# Patient Record
Sex: Female | Born: 1960 | Race: White | Hispanic: No | Marital: Single
Health system: Southern US, Community
[De-identification: ages and names within clinical notes are randomized; demographics above are authoritative.]

## PROBLEM LIST (undated history)

## (undated) DIAGNOSIS — C801 Malignant (primary) neoplasm, unspecified: Secondary | ICD-10-CM

## (undated) DIAGNOSIS — E079 Disorder of thyroid, unspecified: Secondary | ICD-10-CM

## (undated) DIAGNOSIS — I10 Essential (primary) hypertension: Secondary | ICD-10-CM

## (undated) DIAGNOSIS — E119 Type 2 diabetes mellitus without complications: Secondary | ICD-10-CM

## (undated) DIAGNOSIS — G629 Polyneuropathy, unspecified: Secondary | ICD-10-CM

## (undated) DIAGNOSIS — I82409 Acute embolism and thrombosis of unspecified deep veins of unspecified lower extremity: Secondary | ICD-10-CM

## (undated) HISTORY — DX: Acute embolism and thrombosis of unspecified deep veins of unspecified lower extremity: I82.409

## (undated) HISTORY — DX: Type 2 diabetes mellitus without complications: E11.9

## (undated) HISTORY — DX: Essential (primary) hypertension: I10

## (undated) HISTORY — DX: Malignant (primary) neoplasm, unspecified: C80.1

## (undated) HISTORY — DX: Polyneuropathy, unspecified: G62.9

## (undated) HISTORY — DX: Disorder of thyroid, unspecified: E07.9

---

## 2003-05-12 ENCOUNTER — Encounter: Admission: RE | Admit: 2003-05-12 | Discharge: 2003-05-12 | Payer: Self-pay | Admitting: *Deleted

## 2003-05-12 ENCOUNTER — Encounter: Payer: Self-pay | Admitting: General Surgery

## 2020-11-19 ENCOUNTER — Emergency Department (HOSPITAL_BASED_OUTPATIENT_CLINIC_OR_DEPARTMENT_OTHER)
Admission: EM | Admit: 2020-11-19 | Discharge: 2020-11-20 | Disposition: A | Discharge status: Home / Self Care | Payer: Medicare Other | Attending: Emergency Medicine | Admitting: Emergency Medicine

## 2020-11-19 ENCOUNTER — Emergency Department (HOSPITAL_BASED_OUTPATIENT_CLINIC_OR_DEPARTMENT_OTHER): Payer: Medicare Other

## 2020-11-19 ENCOUNTER — Other Ambulatory Visit: Payer: Self-pay

## 2020-11-19 ENCOUNTER — Encounter (HOSPITAL_BASED_OUTPATIENT_CLINIC_OR_DEPARTMENT_OTHER): Payer: Self-pay | Admitting: Emergency Medicine

## 2020-11-19 DIAGNOSIS — Z859 Personal history of malignant neoplasm, unspecified: Secondary | ICD-10-CM | POA: Insufficient documentation

## 2020-11-19 DIAGNOSIS — E119 Type 2 diabetes mellitus without complications: Secondary | ICD-10-CM | POA: Insufficient documentation

## 2020-11-19 DIAGNOSIS — Z7901 Long term (current) use of anticoagulants: Secondary | ICD-10-CM | POA: Diagnosis not present

## 2020-11-19 DIAGNOSIS — S92302A Fracture of unspecified metatarsal bone(s), left foot, initial encounter for closed fracture: Secondary | ICD-10-CM | POA: Insufficient documentation

## 2020-11-19 DIAGNOSIS — I10 Essential (primary) hypertension: Secondary | ICD-10-CM | POA: Diagnosis not present

## 2020-11-19 DIAGNOSIS — W01198A Fall on same level from slipping, tripping and stumbling with subsequent striking against other object, initial encounter: Secondary | ICD-10-CM | POA: Diagnosis not present

## 2020-11-19 DIAGNOSIS — S99922A Unspecified injury of left foot, initial encounter: Secondary | ICD-10-CM | POA: Diagnosis present

## 2020-11-19 DIAGNOSIS — S0990XA Unspecified injury of head, initial encounter: Secondary | ICD-10-CM | POA: Diagnosis not present

## 2020-11-19 LAB — CBG MONITORING, ED: Glucose-Capillary: 108 mg/dL — ABNORMAL HIGH (ref 70–99)

## 2020-11-19 NOTE — ED Notes (Signed)
Pt offered pain medication, refused r/t not having eaten

## 2020-11-19 NOTE — ED Triage Notes (Signed)
Pt arrives pov with family with c/o left foot injury r/t fall pta. Pt endorse feeling "pop". Pt reports hitting head, reports neck pain. Pt denies loc. Swelling noted. Pt no vaccinated for Covid

## 2020-11-19 NOTE — ED Notes (Signed)
Pt endorses concern for elevated glucose, cbg ordered

## 2020-11-19 NOTE — ED Notes (Signed)
Patient transported to CT 

## 2020-11-20 DIAGNOSIS — S92302A Fracture of unspecified metatarsal bone(s), left foot, initial encounter for closed fracture: Secondary | ICD-10-CM | POA: Diagnosis not present

## 2020-11-20 MED ORDER — OXYCODONE-ACETAMINOPHEN 5-325 MG PO TABS
1.0000 | ORAL_TABLET | Freq: Three times a day (TID) | ORAL | 0 refills | Status: DC | PRN
Start: 2020-11-20 — End: 2020-11-20

## 2020-11-20 MED ORDER — OXYCODONE-ACETAMINOPHEN 5-325 MG PO TABS
1.0000 | ORAL_TABLET | Freq: Once | ORAL | Status: AC
  Administered 2020-11-20: 01:00:00 1 via ORAL
  Filled 2020-11-20: qty 1

## 2020-11-20 MED ORDER — OXYCODONE-ACETAMINOPHEN 5-325 MG PO TABS: 1.0000 | ORAL_TABLET | Freq: Three times a day (TID) | ORAL | 0 refills | Status: AC | PRN

## 2020-11-20 NOTE — ED Provider Notes (Signed)
[facility] EMERGENCY DEPARTMENT Provider Note   CSN: [id number] Arrival date & time: 11/19/20  1729     History Chief Complaint  Patient presents with  . Fall    Stacey Hall is a 59 y.o. female.  HPI Patient hurt her left foot on Christmas Eve.  States she stepped and twisted the foot.  States she felt some pops in there.  Continued pain.  Unrelieved with medicines at home.  Some bruising of the foot.  States she also fell and hit her head.  States her neck feels as if there is cracking in there.  States she is on Plavix.  No numbness or weakness.  Has had issues with her left foot in the past.  Patient is incontinent at baseline.  Does have some difficulty getting around the house.  Patient is unvaccinated for Covid. 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  Patient initially stated DVT but it sounds more as if it was the femoral artery.  Patient states she has a walker at home.    Past Medical History:  Diagnosis Date  . Cancer (HCC)   . Diabetes mellitus without complication (HCC)   . DVT (deep venous thrombosis) (HCC)   . Hypertension   . Neuropathy   . Thyroid disease     There are no problems to display for this patient.      OB History   No obstetric history on file.     No family history on file.     Home Medications Prior to Admission medications   Medication Sig Start Date End Date Taking? Authorizing Provider  oxyCODONE-acetaminophen (PERCOCET/ROXICET) 5-325 MG tablet Take 1-2 tablets by mouth every 8 (eight) hours as needed for severe pain. 11/20/20   Benjiman Core, MD    Allergies    Patient has no allergy information on record.  Review of Systems   Review of Systems  Constitutional: Negative for appetite change.  HENT: Negative for congestion.   Respiratory: Positive for cough.   Cardiovascular: Negative for chest pain and leg swelling.  Gastrointestinal: Negative for abdominal pain.  Genitourinary:  Negative for flank pain.  Musculoskeletal: Positive for gait problem and neck pain. Negative for back pain.  Skin: Negative for rash.  Psychiatric/Behavioral: Negative for confusion.    Physical Exam Updated Vital Signs BP 137/74   Pulse 78   Temp 98 F (36.7 C)   Resp 16   Ht 5\' 2"  (1.575 m)   Wt 113.4 kg   SpO2 99%   BMI 45.73 kg/m   Physical Exam Vitals and nursing note reviewed.  Constitutional:      Appearance: She is obese.  HENT:     Head: Atraumatic.     Mouth/Throat:     Mouth: Mucous membranes are moist.  Eyes:     Extraocular Movements: Extraocular movements intact.     Pupils: Pupils are equal, round, and reactive to light.  Cardiovascular:     Rate and Rhythm: Normal rate.  Pulmonary:     Breath sounds: No wheezing or rhonchi.     Comments: Mildly harsh breath sounds without focal rales or rhonchi. Abdominal:     Tenderness: There is no abdominal tenderness.  Musculoskeletal:     Comments: Tenderness over left forefoot.  Some ecchymosis.  Mild cervical spine tenderness.  No deformity.  No step-off.  Skin:    General: Skin is warm.     Capillary Refill: Capillary refill takes less than  2 seconds.  Neurological:     Mental Status: She is alert and oriented to person, place, and time.  Psychiatric:        Mood and Affect: Mood normal.     ED Results / Procedures / Treatments   Labs (all labs ordered are listed, but only abnormal results are displayed) Labs Reviewed  CBG MONITORING, ED - Abnormal; Notable for the following components:      Result Value   Glucose-Capillary 108 (*)    All other components within normal limits    EKG None  Radiology CT Head Wo Contrast  Result Date: 11/19/2020 CLINICAL DATA:  Head trauma, coagulopathy (Age 17-64y) Fall striking head.  No loss of consciousness. EXAM: CT HEAD WITHOUT CONTRAST TECHNIQUE: Contiguous axial images were obtained from the base of the skull through the vertex without intravenous  contrast. COMPARISON:  Report from prior head CT 07/17/2012, images not available. FINDINGS: Brain: No intracranial hemorrhage, mass effect, or midline shift. No hydrocephalus. The basilar cisterns are patent. Mild periventricular chronic small vessel ischemia. No evidence of territorial infarct or acute ischemia. No extra-axial or intracranial fluid collection. Vascular: No hyperdense vessel. Skull: No fracture or focal lesion. Sinuses/Orbits: Paranasal sinuses and mastoid air cells are clear. The visualized orbits are unremarkable. Other: None. IMPRESSION: 1. No acute intracranial abnormality. No skull fracture. 2. Mild chronic small vessel ischemia. Electronically Signed   By: Keith Rake M.D.   On: 11/19/2020 23:44   CT Cervical Spine Wo Contrast  Result Date: 11/19/2020 CLINICAL DATA:  Neck trauma, dangerous injury mechanism (Age 29-64y) Fall striking head.  Neck pain. EXAM: CT CERVICAL SPINE WITHOUT CONTRAST TECHNIQUE: Multidetector CT imaging of the cervical spine was performed without intravenous contrast. Multiplanar CT image reconstructions were also generated. COMPARISON:  None. FINDINGS: Alignment: Straightening of normal lordosis. Scoliotic curvature of the included upper thoracic spine. No traumatic subluxation. Skull base and vertebrae: Anterior fusion C5 through C7, hardware is intact. No acute fracture. Dens and skull base are intact. Soft tissues and spinal canal: No prevertebral fluid or swelling. No visible canal hematoma. Disc levels: Interbody spacer C5-C6 and C6-C7. Minor adjacent level disc space narrowing at C4-C5 and C7-T1. Upper chest: No acute findings. Other: Carotid calcifications versus stents. IMPRESSION: 1. No acute fracture or subluxation of the cervical spine. 2. Anterior fusion C5 through C7, hardware is intact. Electronically Signed   By: Keith Rake M.D.   On: 11/19/2020 23:47   DG Foot Complete Left  Result Date: 11/19/2020 CLINICAL DATA:  Fall, swelling.  EXAM: LEFT FOOT - COMPLETE 3+ VIEW COMPARISON:  Plain film of the LEFT foot dated 07/06/2020. FINDINGS: Displaced fractures of the second and third metatarsal bones. Associated 8 mm avulsion fracture fragment within the soft tissues in between the first and second metatarsal heads. Alignment at the overlying second and third MTP joints remains normal. Associated soft tissue swelling. IMPRESSION: Displaced fractures of the distal second and third metatarsal bones. Associated 8 mm avulsion fracture fragment within the soft tissues between the first and second metatarsal heads. Electronically Signed   By: Franki Cabot M.D.   On: 11/19/2020 19:14    Procedures Procedures (including critical care time)  Medications Ordered in ED Medications  oxyCODONE-acetaminophen (PERCOCET/ROXICET) 5-325 MG per tablet 1 tablet (1 tablet Oral Given 11/20/20 0042)    ED Course  I have reviewed the triage vital signs and the nursing notes.  Pertinent labs & imaging results that were available during my care of the patient were  reviewed by me and considered in my medical decision making (see chart for details).    MDM Rules/Calculators/A&P                         Patient with foot injury.  2 metatarsal fractures.  Some displacement.  Splinted.  Has walker at home.  Will give pain medicine.  Doubt DVT at this time.  Head CT done due to fall and anticoagulation.  Also cervical spine imaging done.  Both overall reassuring.  Discharge home with outpatient follow-up.  Doubt other severe injury from the fall. Final Clinical Impression(s) / ED Diagnoses Final diagnoses:  Closed displaced fracture of metatarsal bone of left foot, unspecified metatarsal, initial encounter    Rx / DC Orders ED Discharge Orders         Ordered    oxyCODONE-acetaminophen (PERCOCET/ROXICET) 5-325 MG tablet  Every 8 hours PRN,   Status:  Discontinued        11/20/20 0124    oxyCODONE-acetaminophen (PERCOCET/ROXICET) 5-325 MG tablet  Every  8 hours PRN        11/20/20 0128           Davonna Belling, MD 11/20/20 0134

## 2020-11-20 NOTE — ED Notes (Signed)
Had to reapply splint due to pt urinating on other splint

## 2020-11-20 NOTE — ED Notes (Signed)
Splint reapplied after pt incontinent of urine. Enc pt to keep splint clean/dry, gave extra gauze roll after re-application of new splint.

## 2022-06-22 IMAGING — CT CT CERVICAL SPINE W/O CM
3 of 4 series · 10 of 33 positions shown, 12 images · non-contrast
Comparison: None.

CLINICAL DATA: Neck trauma, dangerous injury mechanism (Age 16-64y)

Fall striking head.  Neck pain.
EXAM:
CT CERVICAL SPINE WITHOUT CONTRAST
TECHNIQUE: Multidetector CT imaging of the cervical spine was performed without
intravenous contrast. Multiplanar CT image reconstructions were also
generated.

[Series 5: coronals · coronal · 0.23mm/px · 3 of 41 slices shown]
[im 9/41  bone]
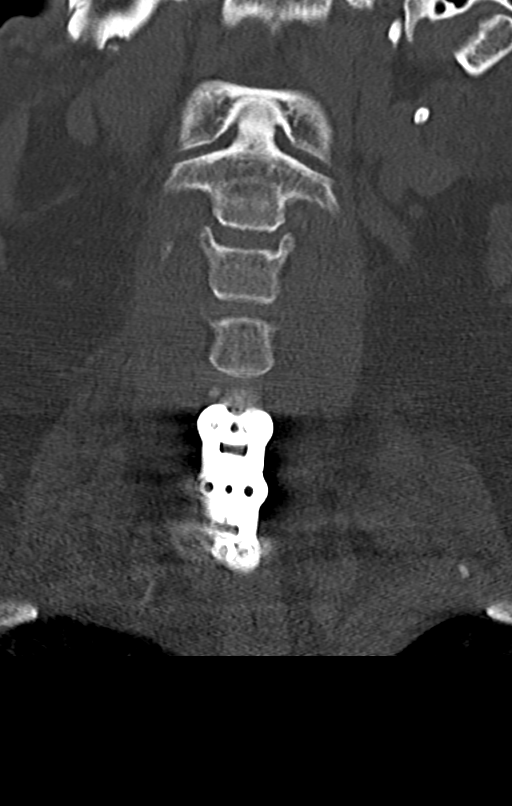
[im 17/41  bone]
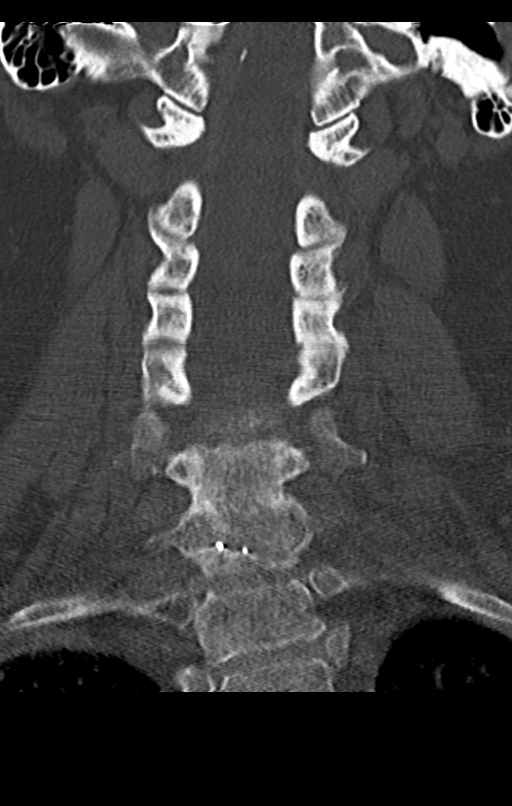
[im 25/41  bone]
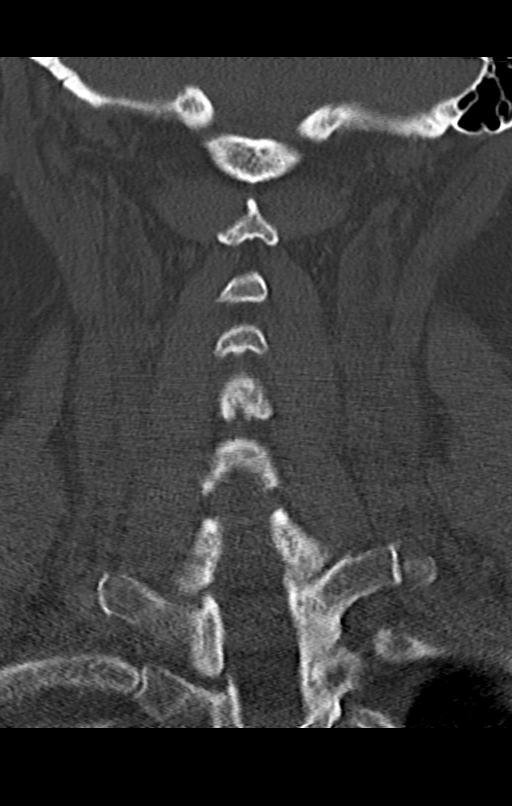

[Series 6: sagittals · sagittal · 0.21mm/px · 5 of 44 slices shown, 6 images]
[im 15/44  bone]
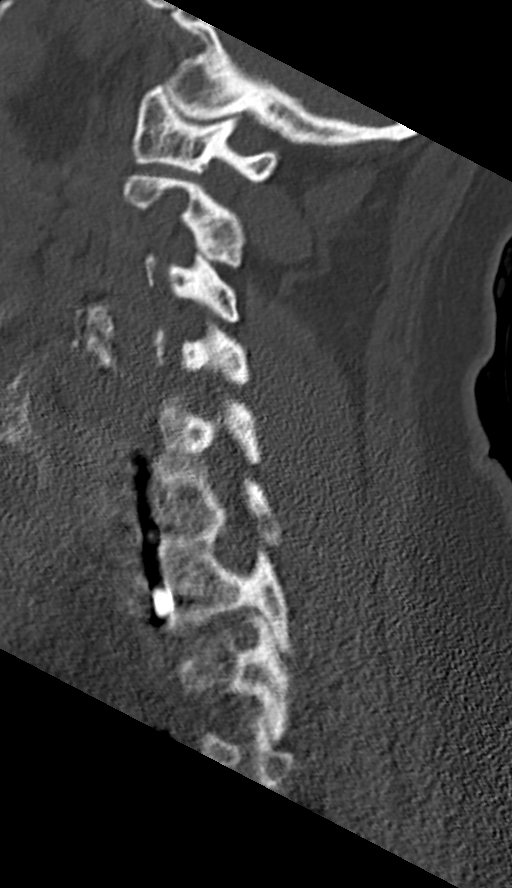
[im 18/44  bone]
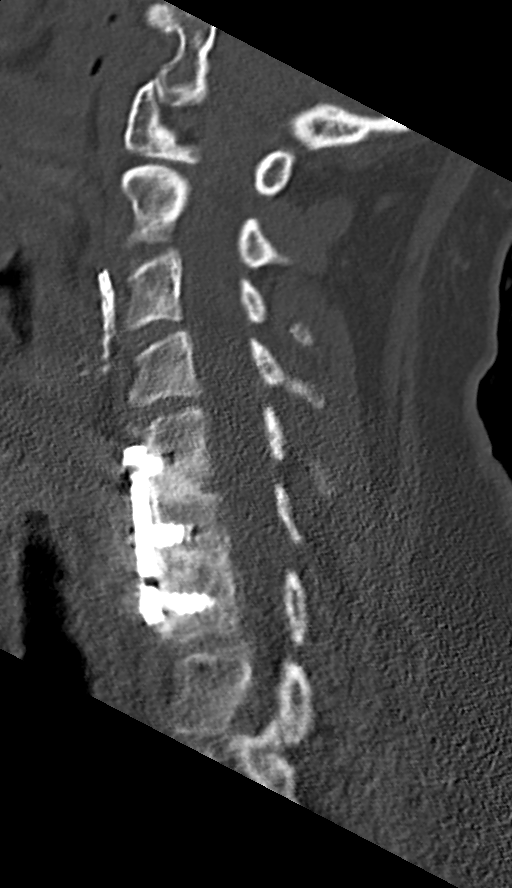
[im 22/44  soft-tissue]
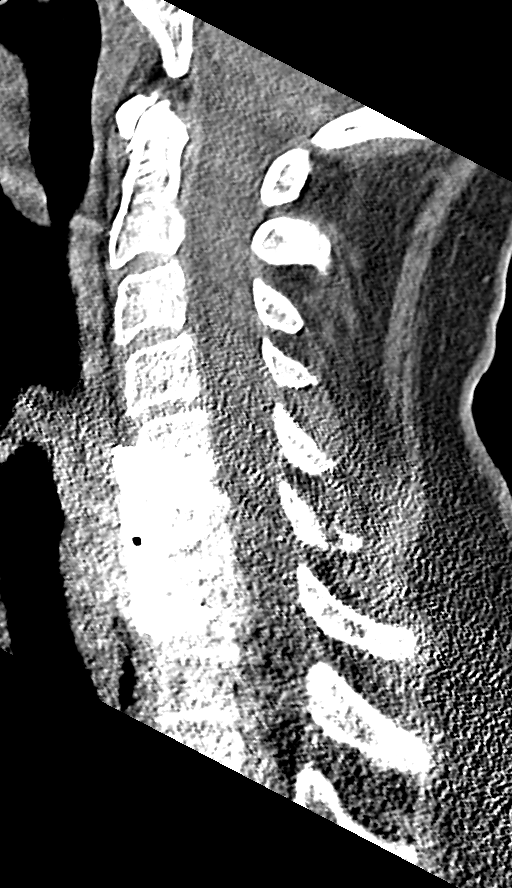
[im 22/44  bone]
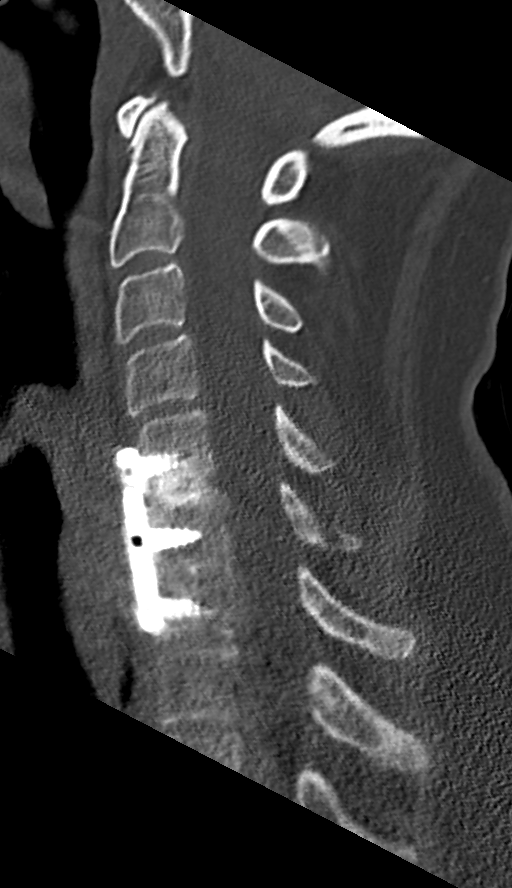
[im 26/44  bone]
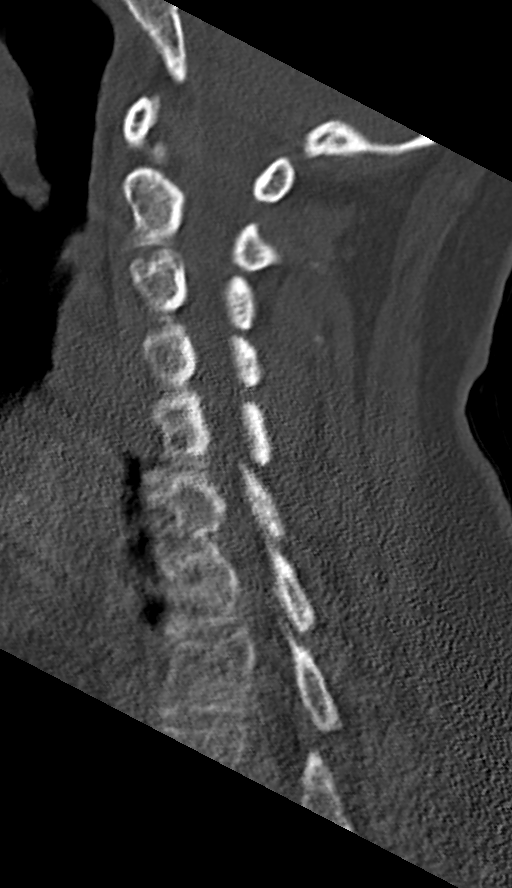
[im 29/44  bone]
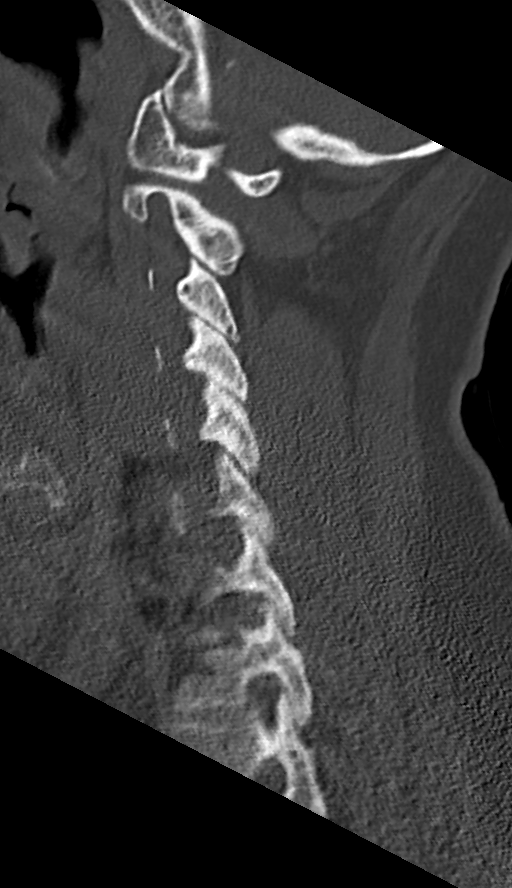

[Series 7: orthogonals · axial · 0.16mm/px · z∈[+899,+972]mm · 2 of 95 slices shown, 3 images]
[im 27/95  soft-tissue]
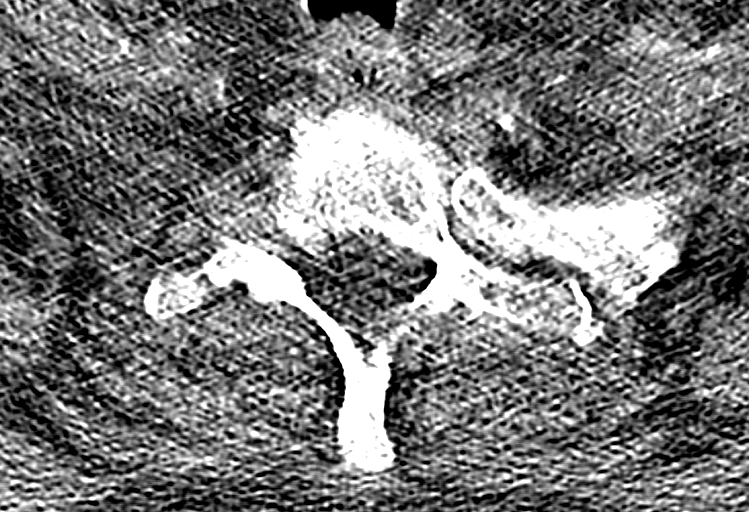
[im 27/95  bone]
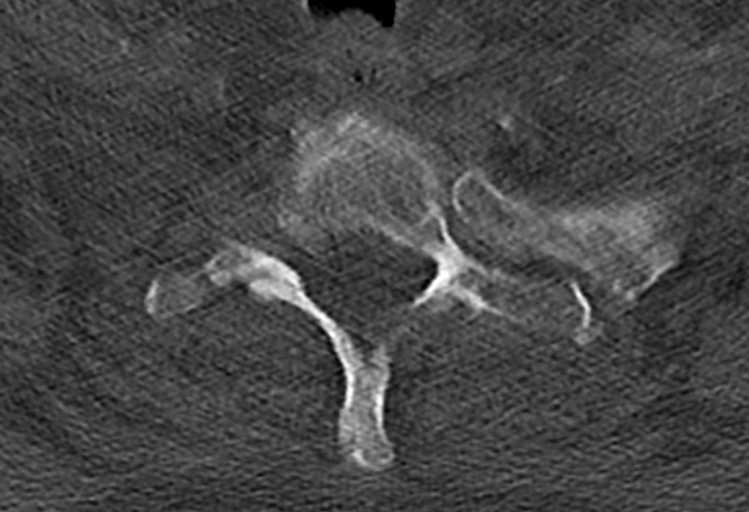
[im 68/95  bone]
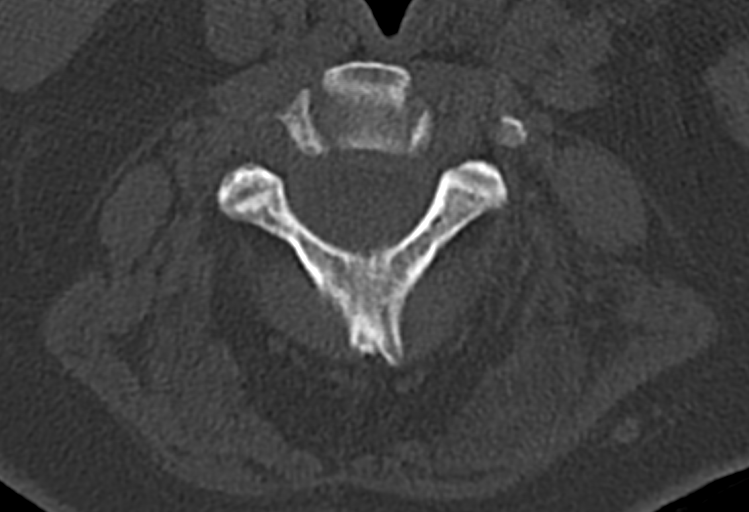

[10 of 33 positions shown; findings below may reference images not displayed]

FINDINGS: Alignment: Straightening of normal lordosis. Scoliotic curvature of
the included upper thoracic spine. No traumatic subluxation.

Skull base and vertebrae: Anterior fusion C5 through C7, hardware is
intact. No acute fracture. Dens and skull base are intact.

Soft tissues and spinal canal: No prevertebral fluid or swelling. No
visible canal hematoma.

Disc levels: Interbody spacer C5-C6 and C6-C7. Minor adjacent level
disc space narrowing at C4-C5 and C7-T1.

Upper chest: No acute findings.

Other: Carotid calcifications versus stents.
IMPRESSION: 1. No acute fracture or subluxation of the cervical spine.
2. Anterior fusion C5 through C7, hardware is intact.

## 2022-06-22 IMAGING — DX DG FOOT COMPLETE 3+V*L*
3 series · 3 of 3 positions shown · non-contrast
Comparison: Plain film of the LEFT foot dated 07/06/2020.

CLINICAL DATA: Fall, swelling.

EXAM:
LEFT FOOT - COMPLETE 3+ VIEW

[foot ap]
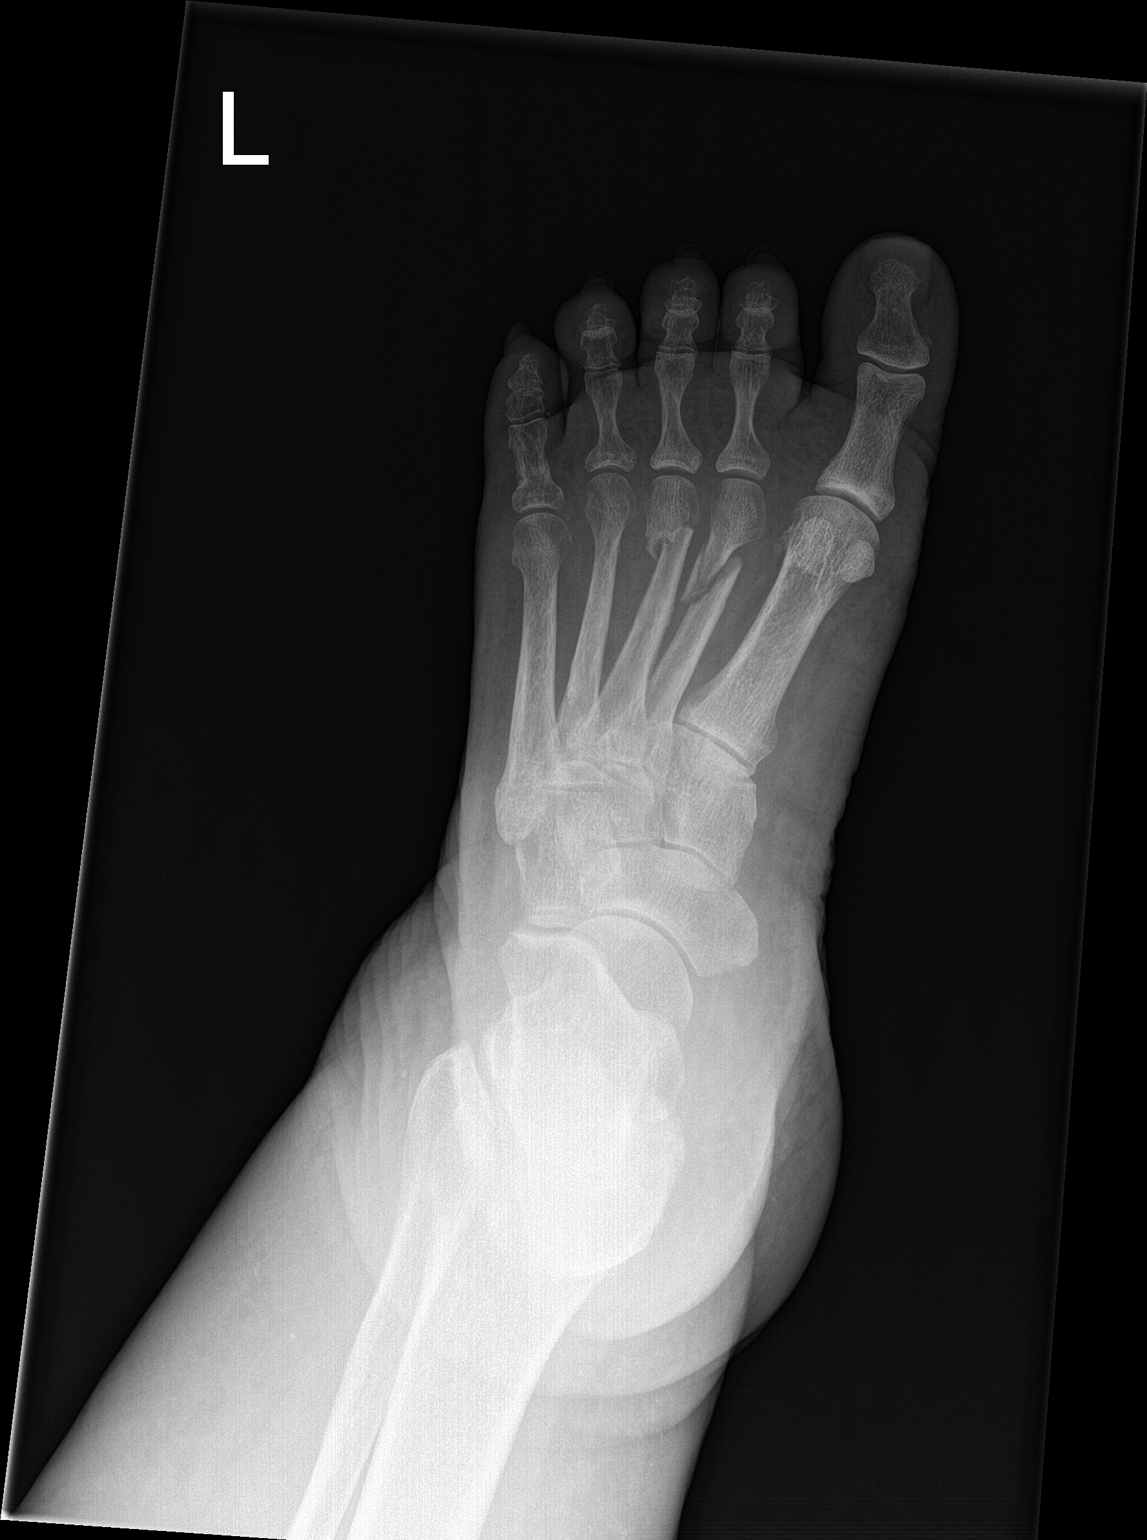

[foot obl]
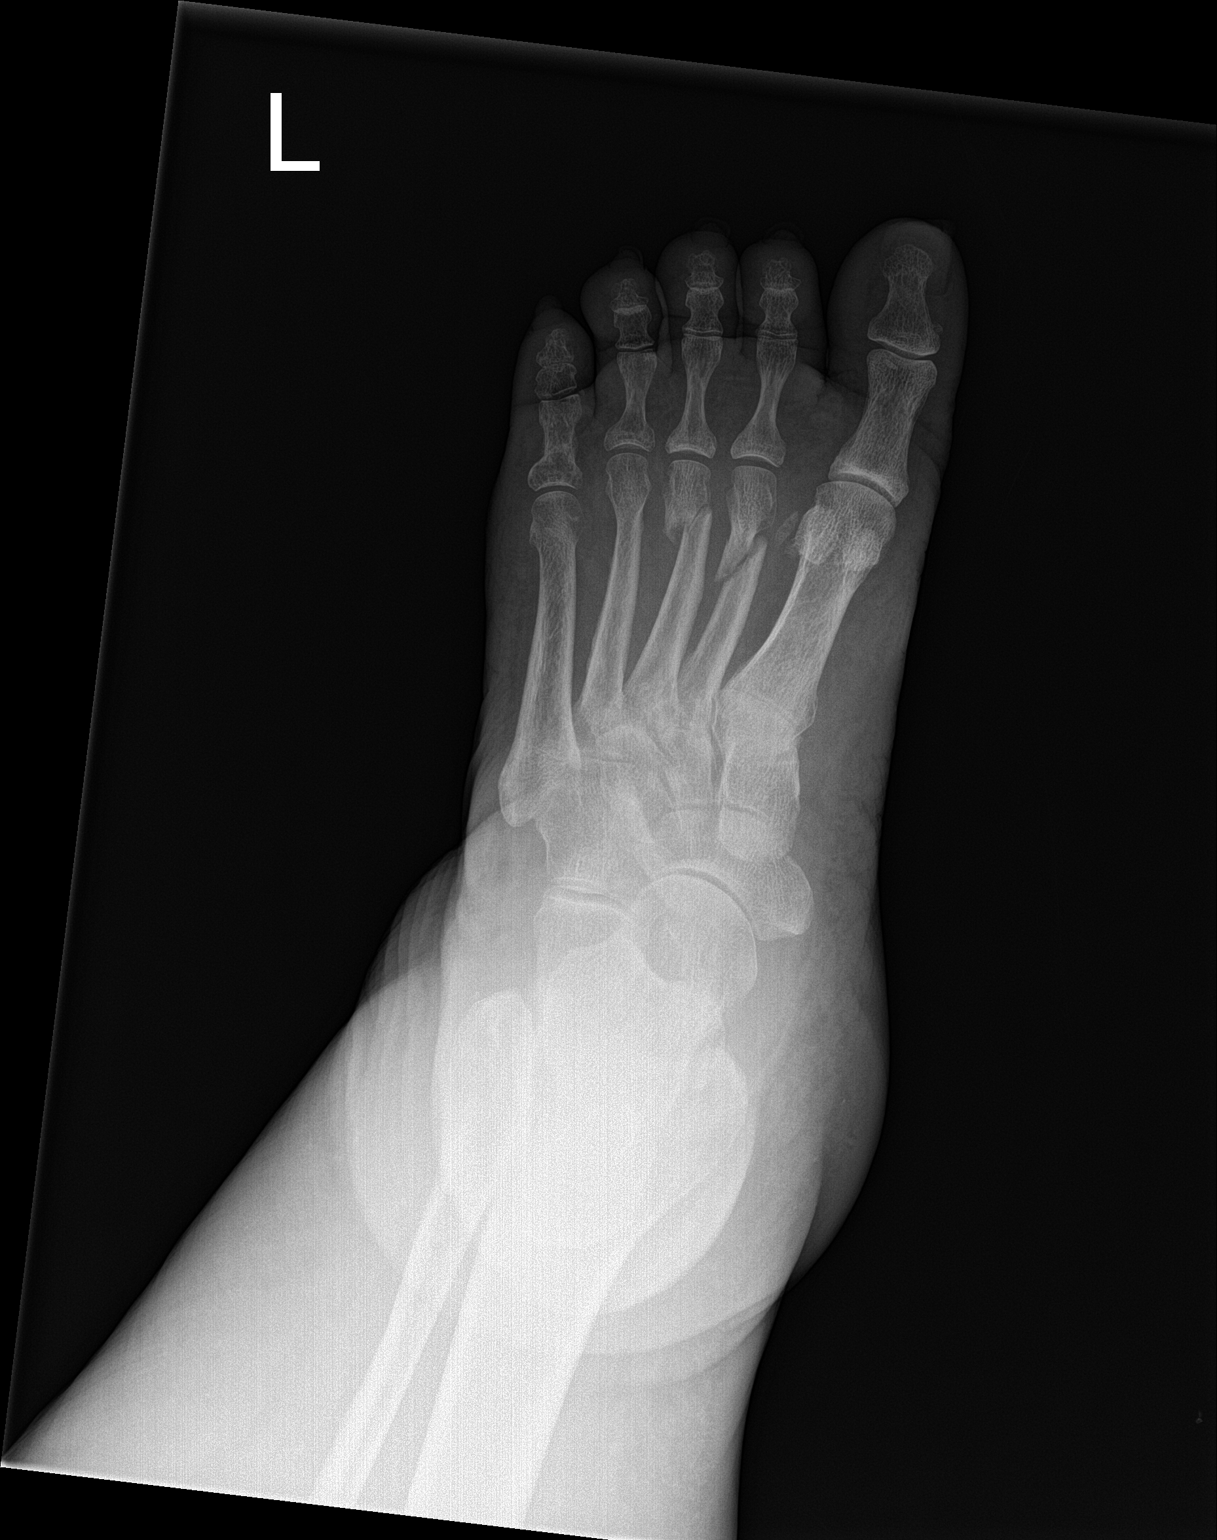

[foot lat]
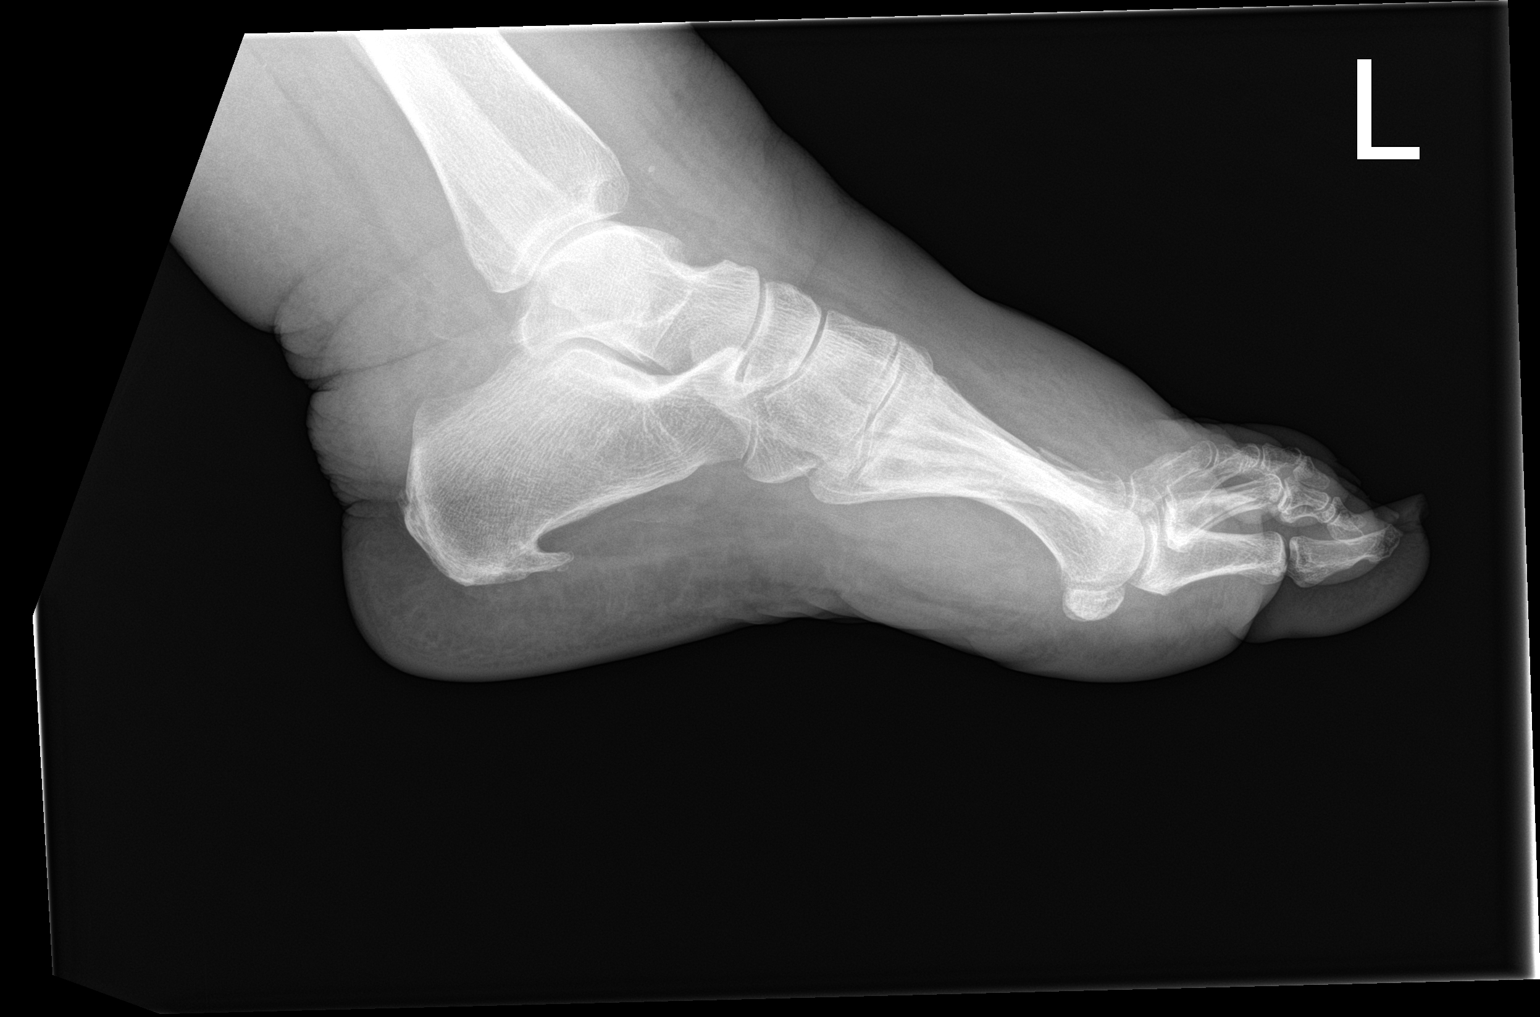

[3 of 3 positions shown; findings below may reference images not displayed]

FINDINGS: Displaced fractures of the second and third metatarsal bones.
Associated 8 mm avulsion fracture fragment within the soft tissues
in between the first and second metatarsal heads. Alignment at the
overlying second and third MTP joints remains normal. Associated
soft tissue swelling.
IMPRESSION: Displaced fractures of the distal second and third metatarsal bones.
Associated 8 mm avulsion fracture fragment within the soft tissues
between the first and second metatarsal heads.

## 2023-01-23 DEATH — deceased
